# Patient Record
Sex: Male | Born: 2000 | Race: White | Hispanic: No | Marital: Single | Smoking: Never smoker
Health system: Southern US, Community
[De-identification: ages and names within clinical notes are randomized; demographics above are authoritative.]

---
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
## 2020-02-19 ENCOUNTER — Ambulatory Visit: Payer: Self-pay

## 2020-02-23 ENCOUNTER — Ambulatory Visit: Payer: Medicaid Other

## 2020-05-25 ENCOUNTER — Other Ambulatory Visit: Payer: Self-pay

## 2020-05-25 ENCOUNTER — Ambulatory Visit
Admission: EM | Admit: 2020-05-25 | Discharge: 2020-05-25 | Disposition: A | Discharge status: Home / Self Care | Payer: Medicaid Other | Attending: Emergency Medicine | Admitting: Emergency Medicine

## 2020-05-25 ENCOUNTER — Ambulatory Visit (INDEPENDENT_AMBULATORY_CARE_PROVIDER_SITE_OTHER): Payer: Medicaid Other

## 2020-05-25 DIAGNOSIS — M79645 Pain in left finger(s): Secondary | ICD-10-CM

## 2020-05-25 DIAGNOSIS — M79642 Pain in left hand: Secondary | ICD-10-CM | POA: Diagnosis not present

## 2020-05-25 NOTE — ED Triage Notes (Signed)
Pt had slip and fall yesterday and injured left hand , swelling noted

## 2020-05-25 NOTE — Discharge Instructions (Addendum)
  Take OTC Tylenol/ibuprofen as needed for pain Use a wrist splint/ may get it at [facility], [facility] or [facility] Follow RICE instruction that is attached Follow-up with PCP Return or go to ED for worsening of symptoms

## 2020-05-25 NOTE — ED Provider Notes (Signed)
[facility]   [id number] 05/25/20 Arrival Time: 1513   Chief Complaint  Patient presents with  . Hand Injury    SUBJECTIVE: History from: patient and family.  Russell Townsend is a 19 y.o. male who presented to the urgent care for complaint of left hand swelling and pain that occurred yesterday.  Reported reported a slip and fall.  He localizes the pain to the left hand.  He describes the pain as constant and achy.  He has tried OTC medications without relief.  His symptoms are made worse with ROM.  He denies similar symptoms in the past.  Denies chills, fever, nausea, vomiting, diarrhea.   ROS: As per HPI.  All other pertinent ROS negative.     History reviewed. No pertinent past medical history. Past Surgical History:  Procedure Laterality Date  . CLOSED REDUCTION ULNAR SHAFT Right 07/01/2015   Procedure: CLOSED REDUCTION RADIUS AND  ULNA  AND CAST APPLICATION;  Surgeon: Betha Loa, MD;  Location: [facility] OR;  Service: Orthopedics;  Laterality: Right;   No Known Allergies No current facility-administered medications on file prior to encounter.   Current Outpatient Medications on File Prior to Encounter  Medication Sig Dispense Refill  . fluticasone (FLONASE) 50 MCG/ACT nasal spray Place 2 sprays into both nostrils daily. 16 g 6  . ibuprofen (ADVIL,MOTRIN) 600 MG tablet Take 1 tablet (600 mg total) by mouth every 6 (six) hours as needed. 30 tablet 0   Social History   Socioeconomic History  . Marital status: Single    Spouse name: Not on file  . Number of children: Not on file  . Years of education: Not on file  . Highest education level: Not on file  Occupational History  . Not on file  Tobacco Use  . Smoking status: Never Smoker  . Smokeless tobacco: Never Used  Substance and Sexual Activity  . Alcohol use: No  . Drug use: No  . Sexual activity: Not on file  Other Topics Concern  . Not on file  Social History Narrative   Lives with paternal grandparents    No smokers   Social Determinants of Corporate investment banker Strain:   . Difficulty of Paying Living Expenses: Not on file  Food Insecurity:   . Worried About Programme researcher, broadcasting/film/video in the Last Year: Not on file  . Ran Out of Food in the Last Year: Not on file  Transportation Needs:   . Lack of Transportation (Medical): Not on file  . Lack of Transportation (Non-Medical): Not on file  Physical Activity:   . Days of Exercise per Week: Not on file  . Minutes of Exercise per Session: Not on file  Stress:   . Feeling of Stress : Not on file  Social Connections:   . Frequency of Communication with Friends and Family: Not on file  . Frequency of Social Gatherings with Friends and Family: Not on file  . Attends Religious Services: Not on file  . Active Member of Clubs or Organizations: Not on file  . Attends Banker Meetings: Not on file  . Marital Status: Not on file  Intimate Partner Violence:   . Fear of Current or Ex-Partner: Not on file  . Emotionally Abused: Not on file  . Physically Abused: Not on file  . Sexually Abused: Not on file   Family History  Problem Relation Age of Onset  . Crohn's disease Mother   . Hypertension Mother   . Healthy  Father   . Healthy Brother   . Hypertension Maternal Grandmother   . Hypertension Maternal Grandfather   . Diabetes Neg Hx   . Heart disease Neg Hx   . Hyperlipidemia Neg Hx   . Cancer Neg Hx     OBJECTIVE:  Vitals:   05/25/20 1630  BP: (!) 147/95  Pulse: 66  Resp: 16  Temp: 98.5 F (36.9 C)  SpO2: 98%     Physical Exam Vitals and nursing note reviewed.  Constitutional:      General: He is not in acute distress.    Appearance: Normal appearance. He is normal weight. He is not ill-appearing, toxic-appearing or diaphoretic.  Cardiovascular:     Rate and Rhythm: Normal rate and regular rhythm.     Pulses: Normal pulses.     Heart sounds: Normal heart sounds. No murmur heard.  No friction rub. No gallop.     Pulmonary:     Effort: Pulmonary effort is normal. No respiratory distress.     Breath sounds: Normal breath sounds. No stridor. No wheezing, rhonchi or rales.  Chest:     Chest wall: No tenderness.  Musculoskeletal:     Right hand: Normal.     Left hand: Swelling and tenderness present.     Comments: The left hand is with obvious deformity compared to the right hand.  No surface trauma, open wound, lesion, warmth.  Mild swelling present.  Limited range of motion due to pain.  Neurovascular status intact.  Neurological:     Mental Status: He is alert and oriented to person, place, and time.      LABS:  No results found for this or any previous visit (from the past 24 hour(s)).   RADIOLOGY:  DG Hand Complete Left  Result Date: 05/25/2020 CLINICAL DATA:  Fall.  Pain in thumb and index finger. EXAM: LEFT HAND - COMPLETE 3+ VIEW COMPARISON:  None. FINDINGS: There is no evidence of fracture or dislocation. There is no evidence of arthropathy or other focal bone abnormality. Soft tissues are unremarkable. IMPRESSION: Negative. Electronically Signed   By: Charlett Nose M.D.   On: 05/25/2020 16:53     Left hand x-ray is negative for bony abnormality including fracture or dislocation.  I have reviewed the x-ray myself and the radiologist interpretation.  I am in agreement with the radiologist interpretation.    ASSESSMENT & PLAN:  1. Left hand pain     No orders of the defined types were placed in this encounter.   Discharge instructions  Take OTC Tylenol/ibuprofen as needed for pain Follow RICE instruction that is attached Follow-up with PCP Return or go to ED for worsening of symptoms  Reviewed expectations re: course of current medical issues. Questions answered. Outlined signs and symptoms indicating need for more acute intervention. Patient verbalized understanding. After Visit Summary given.      Note: This document was prepared using Dragon voice recognition  software and may include unintentional dictation errors.    Durward Parcel, FNP 05/25/20 1707

## 2021-01-24 ENCOUNTER — Other Ambulatory Visit: Payer: Self-pay

## 2021-01-24 ENCOUNTER — Emergency Department (HOSPITAL_COMMUNITY): Payer: Medicaid Other

## 2021-01-24 ENCOUNTER — Emergency Department (HOSPITAL_COMMUNITY)
Admission: EM | Admit: 2021-01-24 | Discharge: 2021-01-24 | Disposition: A | Discharge status: Home / Self Care | Payer: Medicaid Other | Attending: Emergency Medicine | Admitting: Emergency Medicine

## 2021-01-24 ENCOUNTER — Encounter (HOSPITAL_COMMUNITY): Payer: Self-pay | Admitting: *Deleted

## 2021-01-24 DIAGNOSIS — U071 COVID-19: Secondary | ICD-10-CM | POA: Insufficient documentation

## 2021-01-24 DIAGNOSIS — J3489 Other specified disorders of nose and nasal sinuses: Secondary | ICD-10-CM | POA: Insufficient documentation

## 2021-01-24 DIAGNOSIS — Z28310 Unvaccinated for covid-19: Secondary | ICD-10-CM | POA: Insufficient documentation

## 2021-01-24 DIAGNOSIS — R000 Tachycardia, unspecified: Secondary | ICD-10-CM | POA: Diagnosis not present

## 2021-01-24 DIAGNOSIS — R509 Fever, unspecified: Secondary | ICD-10-CM | POA: Diagnosis not present

## 2021-01-24 LAB — CBC WITH DIFFERENTIAL/PLATELET
Abs Immature Granulocytes: 0.05 10*3/uL (ref 0.00–0.07)
Basophils Absolute: 0 10*3/uL (ref 0.0–0.1)
Basophils Relative: 1 %
Eosinophils Absolute: 0.2 10*3/uL (ref 0.0–0.5)
Eosinophils Relative: 2 %
HCT: 48.6 % (ref 39.0–52.0)
Hemoglobin: 16.3 g/dL (ref 13.0–17.0)
Immature Granulocytes: 1 %
Lymphocytes Relative: 10 %
Lymphs Abs: 0.9 10*3/uL (ref 0.7–4.0)
MCH: 30.9 pg (ref 26.0–34.0)
MCHC: 33.5 g/dL (ref 30.0–36.0)
MCV: 92 fL (ref 80.0–100.0)
Monocytes Absolute: 1.2 10*3/uL — ABNORMAL HIGH (ref 0.1–1.0)
Monocytes Relative: 14 %
Neutro Abs: 6.1 10*3/uL (ref 1.7–7.7)
Neutrophils Relative %: 72 %
Platelets: 201 10*3/uL (ref 150–400)
RBC: 5.28 MIL/uL (ref 4.22–5.81)
RDW: 12.1 % (ref 11.5–15.5)
WBC: 8.4 10*3/uL (ref 4.0–10.5)
nRBC: 0 % (ref 0.0–0.2)

## 2021-01-24 LAB — BASIC METABOLIC PANEL
Anion gap: 10 (ref 5–15)
BUN: 9 mg/dL (ref 6–20)
CO2: 25 mmol/L (ref 22–32)
Calcium: 9 mg/dL (ref 8.9–10.3)
Chloride: 99 mmol/L (ref 98–111)
Creatinine, Ser: 1 mg/dL (ref 0.61–1.24)
GFR, Estimated: >60 mL/min (ref >60)
Glucose, Bld: 102 mg/dL — ABNORMAL HIGH (ref 70–99)
Potassium: 4 mmol/L (ref 3.5–5.1)
Sodium: 134 mmol/L — ABNORMAL LOW (ref 135–145)

## 2021-01-24 LAB — RESP PANEL BY RT-PCR (FLU A&B, COVID) ARPGX2
Influenza A by PCR: NEGATIVE
Influenza B by PCR: NEGATIVE
SARS Coronavirus 2 by RT PCR: POSITIVE — AB

## 2021-01-24 MED ORDER — SODIUM CHLORIDE 0.9 % IV BOLUS
1000.0000 mL | Freq: Once | INTRAVENOUS | Status: AC
Start: 2021-01-24 — End: 2021-01-24
  Administered 2021-01-24: 1000 mL via INTRAVENOUS

## 2021-01-24 MED ORDER — ACETAMINOPHEN 325 MG PO TABS
650.0000 mg | ORAL_TABLET | Freq: Once | ORAL | Status: AC | PRN
  Administered 2021-01-24: 650 mg via ORAL
  Filled 2021-01-24: qty 2

## 2021-01-24 NOTE — ED Notes (Signed)
Pt in bed, pt states that he has some generalized body aches, pt states that he is ready to go home.

## 2021-01-24 NOTE — ED Provider Notes (Signed)
3:20 PM-checkout from Dr. Charm Barges to evaluate patient after return of testing.  Dr. Charm Barges suspects that the patient has COVID.  5:45 PM-testing returned, COVID-positive, remainder of labs are reassuring.  Temperature improved following treatment with Tylenol.  At this time the patient is alert nontoxic no respiratory distress.  Findings discussed with the patient about how to care for self during this illness.  Appropriate discharge instructions given   Mancel Bale, MD 01/24/21 1746

## 2021-01-24 NOTE — ED Triage Notes (Signed)
C/o fever, chills, body aches  onset yesterday. Concerned he may have a fever due to working environment

## 2021-01-24 NOTE — Discharge Instructions (Signed)
Is important to wear a mask when you are around other people.  He need to do this for at least 5 days, longer if you continue to be ill with fever, coughing, weakness or vomiting.  Use Tylenol for pain or fever.  Use Robitussin-DM for cough.  See the doctor of your choice for problems during this illness.

## 2021-01-24 NOTE — ED Provider Notes (Signed)
[facility] EMERGENCY DEPARTMENT Provider Note   CSN: [id number] Arrival date & time: 01/24/21  1133     History Chief Complaint  Patient presents with  . Fever    Russell Townsend is a 20 y.o. male.  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  He uses a respirator when he does that but says it does not fit very well.  He is complaining of fevers chills body aches shortness of breath that started yesterday.  He is not COVID vaccinated.  The history is provided by the patient.  Fever Max temp prior to arrival:  103 Temp source:  Oral Severity:  Moderate Onset quality:  Gradual Duration:  2 days Timing:  Intermittent Progression:  Unchanged Chronicity:  New Ineffective treatments:  None tried Associated symptoms: cough, headaches, myalgias and rhinorrhea   Associated symptoms: no chest pain, no confusion, no dysuria, no rash and no sore throat   Risk factors: occupational exposure   Risk factors: no recent travel and no sick contacts        History reviewed. No pertinent past medical history.  Patient Active Problem List   Diagnosis Date Noted  . Closed Salter-Harris type II physeal fracture of distal end of right radius with routine healing 07/29/2015  . Fracture of right radius and ulna 07/01/2015  . Acute maxillary sinusitis 08/19/2014  . Keratosis pilaris 05/05/2014  . Rash 07/15/2013  . Hand, foot and mouth disease 07/15/2013    Past Surgical History:  Procedure Laterality Date  . CLOSED REDUCTION ULNAR SHAFT Right 07/01/2015   Procedure: CLOSED REDUCTION RADIUS AND  ULNA  AND CAST APPLICATION;  Surgeon: Betha Loa, MD;  Location: [facility] OR;  Service: Orthopedics;  Laterality: Right;       Family History  Problem Relation Age of Onset  . Crohn's disease Mother   . Hypertension Mother   . Healthy Father   . Healthy Brother   . Hypertension Maternal Grandmother   . Hypertension  Maternal Grandfather   . Diabetes Neg Hx   . Heart disease Neg Hx   . Hyperlipidemia Neg Hx   . Cancer Neg Hx     Social History   Tobacco Use  . Smoking status: Never Smoker  . Smokeless tobacco: Never Used  Substance Use Topics  . Alcohol use: No  . Drug use: No    Home Medications Prior to Admission medications   Medication Sig Start Date End Date Taking? Authorizing Provider  fluticasone (FLONASE) 50 MCG/ACT nasal spray Place 2 sprays into both nostrils daily. 02/21/16   McDonell, Alfredia Client, MD  ibuprofen (ADVIL,MOTRIN) 600 MG tablet Take 1 tablet (600 mg total) by mouth every 6 (six) hours as needed. 05/27/18   Ivery Quale, PA-C    Allergies    Patient has no known allergies.  Review of Systems   Review of Systems  Constitutional: Positive for fever.  HENT: Positive for rhinorrhea. Negative for sore throat.   Eyes: Negative for visual disturbance.  Respiratory: Positive for cough. Negative for shortness of breath.   Cardiovascular: Negative for chest pain.  Gastrointestinal: Negative for abdominal pain.  Genitourinary: Negative for dysuria.  Musculoskeletal: Positive for myalgias.  Skin: Negative for rash.  Neurological: Positive for headaches.  Psychiatric/Behavioral: Negative for confusion.    Physical Exam Updated Vital Signs BP (!) 142/86 (BP Location: Right Arm)   Pulse (!) 122   Temp (!) 102.5  F (39.2 C) (Oral)   Resp 17   SpO2 93%   Physical Exam Vitals and nursing note reviewed.  Constitutional:      Appearance: Normal appearance. He is well-developed.  HENT:     Head: Normocephalic and atraumatic.     Right Ear: Tympanic membrane normal.     Left Ear: Tympanic membrane normal.     Nose: Nose normal.     Mouth/Throat:     Mouth: Mucous membranes are moist.     Pharynx: Oropharynx is clear. No posterior oropharyngeal erythema.  Eyes:     Conjunctiva/sclera: Conjunctivae normal.  Cardiovascular:     Rate and Rhythm: Regular rhythm.  Tachycardia present.     Heart sounds: No murmur heard.   Pulmonary:     Effort: Pulmonary effort is normal. No respiratory distress.     Breath sounds: Normal breath sounds.  Abdominal:     Palpations: Abdomen is soft.     Tenderness: There is no abdominal tenderness. There is no guarding or rebound.  Musculoskeletal:        General: No deformity or signs of injury. Normal range of motion.     Cervical back: Neck supple. No tenderness.  Lymphadenopathy:     Cervical: No cervical adenopathy.  Skin:    General: Skin is warm and dry.     Capillary Refill: Capillary refill takes less than 2 seconds.  Neurological:     General: No focal deficit present.     Mental Status: He is alert and oriented to person, place, and time.     GCS: GCS eye subscore is 4. GCS verbal subscore is 5. GCS motor subscore is 6.     Motor: No weakness.     ED Results / Procedures / Treatments   Labs (all labs ordered are listed, but only abnormal results are displayed) Labs Reviewed  RESP PANEL BY RT-PCR (FLU A&B, COVID) ARPGX2 - Abnormal; Notable for the following components:      Result Value   SARS Coronavirus 2 by RT PCR POSITIVE (*)    All other components within normal limits  BASIC METABOLIC PANEL - Abnormal; Notable for the following components:   Sodium 134 (*)    Glucose, Bld 102 (*)    All other components within normal limits  CBC WITH DIFFERENTIAL/PLATELET - Abnormal; Notable for the following components:   Monocytes Absolute 1.2 (*)    All other components within normal limits    EKG None  Radiology DG Chest Port 1 View  Result Date: 01/24/2021 CLINICAL DATA:  Fever EXAM: PORTABLE CHEST 1 VIEW COMPARISON:  None. FINDINGS: Lungs are clear. Heart size and pulmonary vascularity are normal. No adenopathy. No bone lesions. IMPRESSION: Lungs clear.  Heart size normal. Electronically Signed   By: Bretta Bang III M.D.   On: 01/24/2021 14:45    Procedures Procedures    Medications Ordered in ED Medications  sodium chloride 0.9 % bolus 1,000 mL (has no administration in time range)  acetaminophen (TYLENOL) tablet 650 mg (650 mg Oral Given 01/24/21 1201)    ED Course  I have reviewed the triage vital signs and the nursing notes.  Pertinent labs & imaging results that were available during my care of the patient were reviewed by me and considered in my medical decision making (see chart for details).  Clinical Course as of 01/24/21 1808  Tue Jan 24, 2021  1433 Chest x-ray interpreted by me as no acute infiltrates. [MB]  Clinical Course User Index [MB] Terrilee Files, MD   MDM Rules/Calculators/A&P                         Russell Townsend was evaluated in Emergency Department on 01/24/2021 for the symptoms described in the history of present illness. He was evaluated in the context of the global COVID-19 pandemic, which necessitated consideration that the patient might be at risk for infection with the SARS-CoV-2 virus that causes COVID-19. Institutional protocols and algorithms that pertain to the evaluation of patients at risk for COVID-19 are in a state of rapid change based on information released by regulatory bodies including the CDC and federal and state organizations. These policies and algorithms were followed during the patient's care in the ED.   Final Clinical Impression(s) / ED Diagnoses Final diagnoses:  COVID-19 virus infection    Rx / DC Orders ED Discharge Orders    None       Terrilee Files, MD 01/24/21 [phone]

## 2021-01-25 ENCOUNTER — Telehealth: Payer: Self-pay | Admitting: *Deleted

## 2021-01-25 NOTE — Telephone Encounter (Signed)
Transition Care Management Follow-up Telephone Call  Date of discharge and from where: 01/24/2021 - [facility]  How have you been since you were released from the hospital? "About the same"  Any questions or concerns? No  Items Reviewed:  Did the pt receive and understand the discharge instructions provided? Yes   Medications obtained and verified? Yes   Other? No   Any new allergies since your discharge? No   Dietary orders reviewed? No  Do you have support at home? Yes    Functional Questionnaire: (I = Independent and D = Dependent) ADLs: I  Bathing/Dressing- I  Meal Prep- I  Eating- I  Maintaining continence- I  Transferring/Ambulation- I  Managing Meds- I  Follow up appointments reviewed:   PCP Hospital f/u appt confirmed? No    Specialist Hospital f/u appt confirmed? No    Are transportation arrangements needed? N/A  If their condition worsens, is the pt aware to call PCP or go to the Emergency Dept.? Yes  Was the patient provided with contact information for the PCP's office or ED? Yes  Was to pt encouraged to call back with questions or concerns? Yes

## 2021-01-31 ENCOUNTER — Other Ambulatory Visit: Payer: Self-pay

## 2021-01-31 ENCOUNTER — Emergency Department (HOSPITAL_COMMUNITY)
Admission: EM | Admit: 2021-01-31 | Discharge: 2021-01-31 | Disposition: A | Discharge status: Home / Self Care | Payer: Medicaid Other | Attending: Emergency Medicine | Admitting: Emergency Medicine

## 2021-01-31 ENCOUNTER — Encounter (HOSPITAL_COMMUNITY): Payer: Self-pay | Admitting: Emergency Medicine

## 2021-01-31 DIAGNOSIS — U071 COVID-19: Secondary | ICD-10-CM | POA: Diagnosis not present

## 2021-01-31 DIAGNOSIS — H9202 Otalgia, left ear: Secondary | ICD-10-CM | POA: Insufficient documentation

## 2021-01-31 DIAGNOSIS — R509 Fever, unspecified: Secondary | ICD-10-CM | POA: Diagnosis present

## 2021-01-31 MED ORDER — ONDANSETRON 4 MG PO TBDP: 4.0000 mg | ORAL_TABLET | Freq: Three times a day (TID) | ORAL | 1 refills | Status: DC | PRN

## 2021-01-31 NOTE — Discharge Instructions (Addendum)
Left ear shows some signs of pressure congestion.  But not consistent with the ear infection.  Zofran provided if the nausea and vomiting comes back.  Also would recommend taking 400 mg of Motrin, ibuprofen, and Advil every 6 hours for the next few days to help cut down the inflammation of the left ear.  Also could consider taking over-the-counter cold and flu meds.

## 2021-01-31 NOTE — ED Triage Notes (Signed)
Pt c/o left sided facial/jaw pain for the past few days. States pain is moving towards his ear. Pt dx with COVID on 01/24/21.

## 2021-01-31 NOTE — ED Provider Notes (Signed)
[facility] EMERGENCY DEPARTMENT Provider Note   CSN: [id number] Arrival date & time: 01/31/21  4158     History No chief complaint on file.   Russell Townsend is a 20 y.o. male.  Patient's been symptomatic with COVID infection since Friday.  Patient formally diagnosed on May 17.  Patient states has been taken Tylenol for his fever.  Has not had any difficulty with shortness of breath.  Does have some congestion.  Came in today because developed pain left side jaw left ear area.  Moved up into his temporal area of his head.  Had a headache and had nausea and vomiting.  All symptoms now have improved significantly.  Patient is never experienced anything like that before.  Denies any tooth pain.  Denies any pain with opening closing his jaw.  Symptoms onset was about 4 in the morning.  Now feeling much better.        History reviewed. No pertinent past medical history.  Patient Active Problem List   Diagnosis Date Noted  . Closed Salter-Harris type II physeal fracture of distal end of right radius with routine healing 07/29/2015  . Fracture of right radius and ulna 07/01/2015  . Acute maxillary sinusitis 08/19/2014  . Keratosis pilaris 05/05/2014  . Rash 07/15/2013  . Hand, foot and mouth disease 07/15/2013    Past Surgical History:  Procedure Laterality Date  . CLOSED REDUCTION ULNAR SHAFT Right 07/01/2015   Procedure: CLOSED REDUCTION RADIUS AND  ULNA  AND CAST APPLICATION;  Surgeon: Betha Loa, MD;  Location: [facility] OR;  Service: Orthopedics;  Laterality: Right;       Family History  Problem Relation Age of Onset  . Crohn's disease Mother   . Hypertension Mother   . Healthy Father   . Healthy Brother   . Hypertension Maternal Grandmother   . Hypertension Maternal Grandfather   . Diabetes Neg Hx   . Heart disease Neg Hx   . Hyperlipidemia Neg Hx   . Cancer Neg Hx     Social History   Tobacco Use  . Smoking status: Never Smoker  . Smokeless tobacco: Never Used   Substance Use Topics  . Alcohol use: No  . Drug use: No    Home Medications Prior to Admission medications   Medication Sig Start Date End Date Taking? Authorizing Provider  ondansetron (ZOFRAN ODT) 4 MG disintegrating tablet Take 1 tablet (4 mg total) by mouth every 8 (eight) hours as needed. 01/31/21  Yes Vanetta Mulders, MD  fluticasone (FLONASE) 50 MCG/ACT nasal spray Place 2 sprays into both nostrils daily. 02/21/16   McDonell, Alfredia Client, MD  ibuprofen (ADVIL,MOTRIN) 600 MG tablet Take 1 tablet (600 mg total) by mouth every 6 (six) hours as needed. 05/27/18   Ivery Quale, PA-C    Allergies    Patient has no known allergies.  Review of Systems   Review of Systems  Constitutional: Negative for chills and fever.  HENT: Positive for congestion and ear pain. Negative for rhinorrhea and sore throat.   Eyes: Negative for visual disturbance.  Respiratory: Negative for cough and shortness of breath.   Cardiovascular: Negative for chest pain and leg swelling.  Gastrointestinal: Negative for abdominal pain, diarrhea, nausea and vomiting.  Genitourinary: Negative for dysuria.  Musculoskeletal: Negative for back pain and neck pain.  Skin: Negative for rash.  Neurological: Negative for dizziness, light-headedness and headaches.  Hematological: Does not bruise/bleed easily.  Psychiatric/Behavioral: Negative for confusion.    Physical Exam Updated Vital Signs  BP 125/80   Pulse 88   Temp 98.4 F (36.9 C)   Resp 18   Ht 1.753 m (5\' 9" )   Wt 87.1 kg   SpO2 98%   BMI 28.36 kg/m   Physical Exam Vitals and nursing note reviewed.  Constitutional:      General: He is not in acute distress.    Appearance: Normal appearance. He is well-developed.  HENT:     Head: Normocephalic and atraumatic.     Right Ear: Tympanic membrane and ear canal normal. There is no impacted cerumen.     Left Ear: There is no impacted cerumen.     Ears:     Comments: Right tympanic membrane and canals  normal.  Left tympanic membrane has some slight erythema but no bulging.  External canals normal. Eyes:     Extraocular Movements: Extraocular movements intact.     Conjunctiva/sclera: Conjunctivae normal.     Pupils: Pupils are equal, round, and reactive to light.  Cardiovascular:     Rate and Rhythm: Normal rate and regular rhythm.     Heart sounds: No murmur heard.   Pulmonary:     Effort: Pulmonary effort is normal. No respiratory distress.     Breath sounds: Normal breath sounds.  Abdominal:     Palpations: Abdomen is soft.     Tenderness: There is no abdominal tenderness.  Musculoskeletal:     Cervical back: Normal range of motion and neck supple. No rigidity.  Lymphadenopathy:     Cervical: No cervical adenopathy.  Skin:    General: Skin is warm and dry.     Capillary Refill: Capillary refill takes less than 2 seconds.  Neurological:     General: No focal deficit present.     Mental Status: He is alert and oriented to person, place, and time.     Cranial Nerves: No cranial nerve deficit.     Sensory: No sensory deficit.     Motor: No weakness.     ED Results / Procedures / Treatments   Labs (all labs ordered are listed, but only abnormal results are displayed) Labs Reviewed - No data to display  EKG None  Radiology No results found.  Procedures Procedures   Medications Ordered in ED Medications - No data to display  ED Course  I have reviewed the triage vital signs and the nursing notes.  Pertinent labs & imaging results that were available during my care of the patient were reviewed by me and considered in my medical decision making (see chart for details).    MDM Rules/Calculators/A&P                          This patient not having any ear pain now.  Think that pain was secondary to eustachian tube dysfunction and some pressure buildup.  There is definitely some evidence of the left TM being erythematous.  But not bulging.  And since no pain now do  not think it is an acute otitis media.  Will provide patient with some Zofran for the nausea and vomiting which has now resolved in case it comes back have him take Motrin for the next several days.  And consider over-the-counter cold and flu so that a decongestant would help keep the congestion down.  Overall from the COVID patient is doing very well.  No hypoxia.  No shortness of breath.  Patient states no longer having fevers.   Final Clinical Impression(s) /  ED Diagnoses Final diagnoses:  COVID  Left ear pain    Rx / DC Orders ED Discharge Orders         Ordered    ondansetron (ZOFRAN ODT) 4 MG disintegrating tablet  Every 8 hours PRN        01/31/21 0814           Vanetta Mulders, MD 01/31/21 [phone]

## 2021-01-31 NOTE — ED Notes (Signed)
See triage notes. Nad.  

## 2021-02-01 ENCOUNTER — Telehealth: Payer: Self-pay | Admitting: *Deleted

## 2021-02-01 NOTE — Telephone Encounter (Signed)
Transition Care Management Follow-up Telephone Call  Date of discharge and from where: 01/31/2021 - [facility]  How have you been since you were released from the hospital? "I am okay"  Any questions or concerns? No  Items Reviewed:  Did the pt receive and understand the discharge instructions provided? Yes   Medications obtained and verified? Yes   Other? No   Any new allergies since your discharge? No   Dietary orders reviewed? No  Do you have support at home? Yes     Functional Questionnaire: (I = Independent and D = Dependent) ADLs: I  Bathing/Dressing- I  Meal Prep- I  Eating- I  Maintaining continence- I  Transferring/Ambulation- I  Managing Meds- I  Follow up appointments reviewed:   PCP Hospital f/u appt confirmed? No    Specialist Hospital f/u appt confirmed? No    Are transportation arrangements needed? No   If their condition worsens, is the pt aware to call PCP or go to the Emergency Dept.? Yes  Was the patient provided with contact information for the PCP's office or ED? Yes  Was to pt encouraged to call back with questions or concerns? Yes

## 2021-05-25 DIAGNOSIS — Z77120 Contact with and (suspected) exposure to mold (toxic): Secondary | ICD-10-CM | POA: Diagnosis not present

## 2021-05-25 DIAGNOSIS — Z68.41 Body mass index (BMI) pediatric, 85th percentile to less than 95th percentile for age: Secondary | ICD-10-CM | POA: Diagnosis not present

## 2021-05-25 DIAGNOSIS — J069 Acute upper respiratory infection, unspecified: Secondary | ICD-10-CM | POA: Diagnosis not present

## 2021-05-25 DIAGNOSIS — R059 Cough, unspecified: Secondary | ICD-10-CM | POA: Diagnosis not present

## 2021-05-25 DIAGNOSIS — R0981 Nasal congestion: Secondary | ICD-10-CM | POA: Diagnosis not present

## 2021-07-11 DIAGNOSIS — H1013 Acute atopic conjunctivitis, bilateral: Secondary | ICD-10-CM | POA: Diagnosis not present

## 2021-07-11 DIAGNOSIS — J3081 Allergic rhinitis due to animal (cat) (dog) hair and dander: Secondary | ICD-10-CM | POA: Diagnosis not present

## 2021-07-11 DIAGNOSIS — J3089 Other allergic rhinitis: Secondary | ICD-10-CM | POA: Diagnosis not present

## 2021-07-11 DIAGNOSIS — J301 Allergic rhinitis due to pollen: Secondary | ICD-10-CM | POA: Diagnosis not present

## 2021-10-21 IMAGING — DX DG CHEST 1V PORT
1 series · 1 of 1 positions shown · non-contrast
Comparison: None.

CLINICAL DATA: Fever

EXAM:
PORTABLE CHEST 1 VIEW

[chest ap]
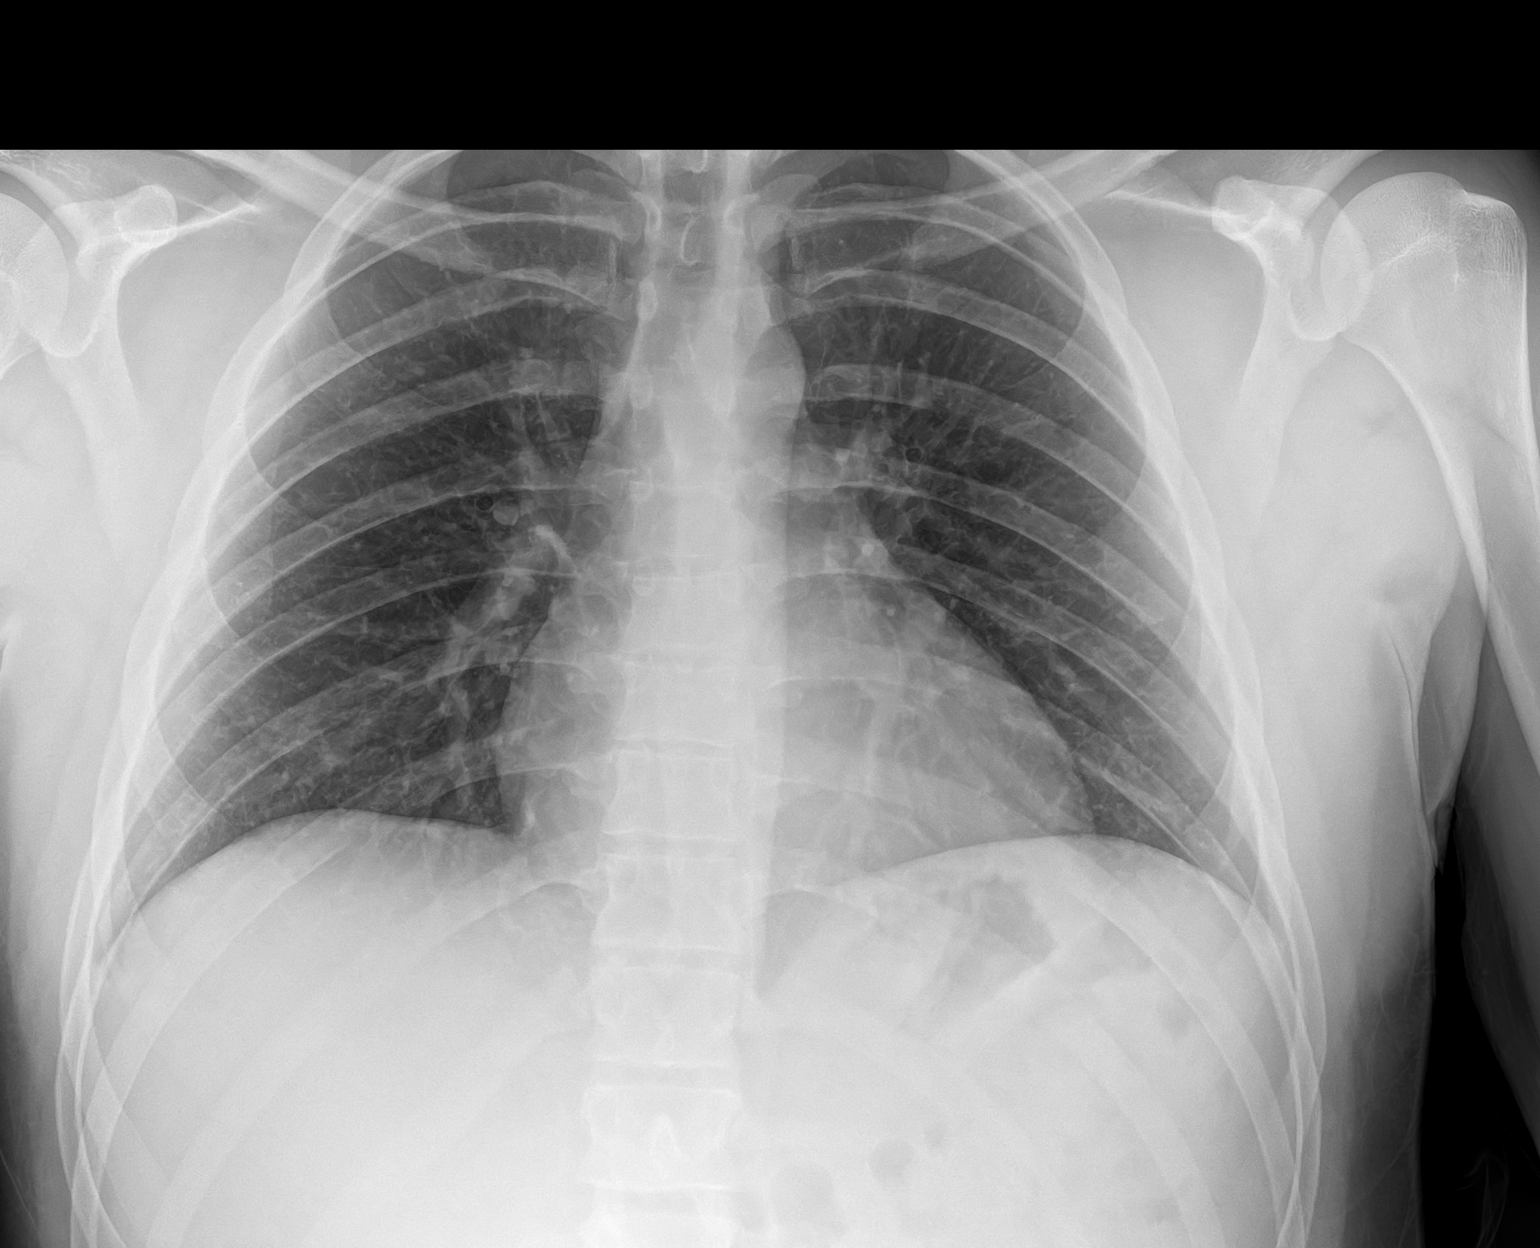

[1 of 1 positions shown; findings below may reference images not displayed]

FINDINGS: Lungs are clear. Heart size and pulmonary vascularity are normal. No
adenopathy. No bone lesions.
IMPRESSION: Lungs clear.  Heart size normal.

## 2022-09-21 ENCOUNTER — Emergency Department (HOSPITAL_COMMUNITY)
Admission: EM | Admit: 2022-09-21 | Discharge: 2022-09-21 | Disposition: A | Discharge status: Home / Self Care | Payer: Medicaid Other | Attending: Emergency Medicine | Admitting: Emergency Medicine

## 2022-09-21 ENCOUNTER — Other Ambulatory Visit: Payer: Self-pay

## 2022-09-21 DIAGNOSIS — Z20822 Contact with and (suspected) exposure to covid-19: Secondary | ICD-10-CM | POA: Diagnosis not present

## 2022-09-21 DIAGNOSIS — J029 Acute pharyngitis, unspecified: Secondary | ICD-10-CM | POA: Insufficient documentation

## 2022-09-21 LAB — RESP PANEL BY RT-PCR (RSV, FLU A&B, COVID)  RVPGX2
Influenza A by PCR: NEGATIVE
Influenza B by PCR: NEGATIVE
Resp Syncytial Virus by PCR: NEGATIVE
SARS Coronavirus 2 by RT PCR: NEGATIVE

## 2022-09-21 LAB — GROUP A STREP BY PCR: Group A Strep by PCR: NOT DETECTED

## 2022-09-21 MED ORDER — LIDOCAINE VISCOUS HCL 2 % MT SOLN
15.0000 mL | Freq: Once | OROMUCOSAL | Status: AC
  Administered 2022-09-21: 15 mL via ORAL
  Filled 2022-09-21: qty 15

## 2022-09-21 MED ORDER — ALUM & MAG HYDROXIDE-SIMETH 200-200-20 MG/5ML PO SUSP
30.0000 mL | Freq: Once | ORAL | Status: AC
  Administered 2022-09-21: 30 mL via ORAL
  Filled 2022-09-21: qty 30

## 2022-09-21 NOTE — ED Triage Notes (Signed)
Pt c/o sore throat, headache, aching in legs, nasal drainage x 5 days. Afebrile.

## 2022-09-21 NOTE — ED Notes (Signed)
ED Provider at bedside. 

## 2022-09-21 NOTE — ED Provider Notes (Signed)
[facility] Provider Note   CSN: [id number] Arrival date & time: 09/21/22  9528     History  Chief Complaint  Patient presents with   Sore Throat    Russell Townsend is a 22 y.o. male.   Sore Throat     Patient presents due to sore throat x 5 days.  Symptoms have been constant, also endorses swollen lymph nodes, nasal congestion.  No medicine prior to arrival.  No fevers at home, with her intermediate strep throat over Christmas.  Here because his job requires him to be evaluated before returning back to work.  Denies any chest pain, shortness of breath, vomiting, diarrhea.  Home Medications Prior to Admission medications   Medication Sig Start Date End Date Taking? Authorizing Provider  fluticasone (FLONASE) 50 MCG/ACT nasal spray Place 2 sprays into both nostrils daily. 02/21/16   McDonell, Kyra Manges, MD  ibuprofen (ADVIL,MOTRIN) 600 MG tablet Take 1 tablet (600 mg total) by mouth every 6 (six) hours as needed. 05/27/18   Lily Kocher, PA-C  ondansetron (ZOFRAN ODT) 4 MG disintegrating tablet Take 1 tablet (4 mg total) by mouth every 8 (eight) hours as needed. 01/31/21   Fredia Sorrow, MD      Allergies    Patient has no known allergies.    Review of Systems   Review of Systems  Physical Exam Updated Vital Signs BP (!) 146/83   Pulse 66   Temp 98.6 F (37 C) (Oral)   Resp 15   Ht 5\' 10"  (1.778 m)   Wt 79.4 kg   SpO2 100%   BMI 25.11 kg/m  Physical Exam Vitals and nursing note reviewed. Exam conducted with a chaperone present.  Constitutional:      General: He is not in acute distress.    Appearance: Normal appearance.  HENT:     Head: Normocephalic and atraumatic.     Mouth/Throat:     Pharynx: Uvula midline. Posterior oropharyngeal erythema present.     Tonsils: No tonsillar exudate.  Eyes:     General: No scleral icterus.       Right eye: No discharge.        Left eye: No discharge.     Extraocular Movements: Extraocular movements  intact.     Pupils: Pupils are equal, round, and reactive to light.  Cardiovascular:     Rate and Rhythm: Normal rate and regular rhythm.     Pulses: Normal pulses.     Heart sounds: Normal heart sounds.     No friction rub. No gallop.  Pulmonary:     Effort: Pulmonary effort is normal. No respiratory distress.     Breath sounds: Normal breath sounds.     Comments: Lungs are clear to auscultation bilaterally Abdominal:     General: Abdomen is flat. Bowel sounds are normal. There is no distension.     Palpations: Abdomen is soft.     Tenderness: There is no abdominal tenderness.  Skin:    General: Skin is warm and dry.     Coloration: Skin is not jaundiced.  Neurological:     Mental Status: He is alert. Mental status is at baseline.     Coordination: Coordination normal.     ED Results / Procedures / Treatments   Labs (all labs ordered are listed, but only abnormal results are displayed) Labs Reviewed  GROUP A STREP BY PCR  RESP PANEL BY RT-PCR (RSV, FLU A&B, COVID)  RVPGX2    EKG None  Radiology No results found.  Procedures Procedures    Medications Ordered in ED Medications  alum & mag hydroxide-simeth (MAALOX/MYLANTA) 200-200-20 MG/5ML suspension 30 mL (has no administration in time range)    And  lidocaine (XYLOCAINE) 2 % viscous mouth solution 15 mL (has no administration in time range)    ED Course/ Medical Decision Making/ A&P Clinical Course as of 09/21/22 0952  Fri Sep 21, 2022  0941 Group A Strep by PCR Negative [HS]  0952 Resp panel by RT-PCR (RSV, Flu A&B, Covid) Throat Negative  [HS]    Clinical Course User Index [HS] Sherrill Raring, PA-C                           Medical Decision Making Amount and/or Complexity of Data Reviewed Labs:  Decision-making details documented in ED Course.  Risk OTC drugs. Prescription drug management.   Patient presents to the emergency department due to sore throat x 5 days.  Differential includes not  limited to strep pharyngitis, viral pharyngitis, Ludwig angina, retropharyngeal abscess, peritonsillar abscess, sepsis.   On exam patient is tolerating secretions, no signs of Ludwig angina, peritonsillar abscess, retropharyngeal abscess.  No SIRS criteria. Suspect viral tonsillitis versus strep tonsillitis.  Will swab for viral panel and strep test.   I ordered Maalox and viscous lidocaine for symptom management in the ED.  I ordered, viewed and interpreted laboratory workup as documented ED course.  None  Patient has a viral diagnosis, negative respiratory panel for COVID, flu and negative for strep.  Return precautions discussed, stable for discharge at this time.        Final Clinical Impression(s) / ED Diagnoses Final diagnoses:  None    Rx / DC Orders ED Discharge Orders     None         Sherrill Raring, Hershal Coria 09/21/22 Barth Kirks, MD 09/22/22 1011

## 2022-09-21 NOTE — Discharge Instructions (Addendum)
You're workup was reassuring.  I suspect you have a viral process, follow-up with your primary doctor next week for reevaluation if symptoms persist.  Go back to work on Monday.  Wear a mask as long as you are symptomatic.

## 2022-09-30 ENCOUNTER — Other Ambulatory Visit: Payer: Self-pay

## 2022-09-30 ENCOUNTER — Encounter (HOSPITAL_COMMUNITY): Payer: Self-pay

## 2022-09-30 ENCOUNTER — Emergency Department (HOSPITAL_COMMUNITY)
Admission: EM | Admit: 2022-09-30 | Discharge: 2022-09-30 | Disposition: A | Discharge status: Home / Self Care | Payer: Medicaid Other | Attending: Emergency Medicine | Admitting: Emergency Medicine

## 2022-09-30 DIAGNOSIS — J029 Acute pharyngitis, unspecified: Secondary | ICD-10-CM | POA: Diagnosis not present

## 2022-09-30 MED ORDER — PREDNISONE 20 MG PO TABS: 40.0000 mg | ORAL_TABLET | Freq: Every day | ORAL | 0 refills | Status: DC

## 2022-09-30 MED ORDER — AMOXICILLIN 250 MG PO CAPS
500.0000 mg | ORAL_CAPSULE | Freq: Once | ORAL | Status: AC
  Administered 2022-09-30: 500 mg via ORAL
  Filled 2022-09-30: qty 2

## 2022-09-30 MED ORDER — PREDNISONE 50 MG PO TABS
60.0000 mg | ORAL_TABLET | Freq: Once | ORAL | Status: AC
  Administered 2022-09-30: 60 mg via ORAL
  Filled 2022-09-30: qty 1

## 2022-09-30 MED ORDER — AMOXICILLIN 500 MG PO CAPS: 500.0000 mg | ORAL_CAPSULE | Freq: Three times a day (TID) | ORAL | 0 refills | Status: DC

## 2022-09-30 NOTE — ED Provider Notes (Signed)
[facility] Provider Note   CSN: [id number] Arrival date & time: 09/30/22  0244     History  Chief Complaint  Patient presents with   Sore Throat    Russell Townsend is a 22 y.o. male.  Patient presents to the emergency department for evaluation of persistent sore throat.  He was seen in the emergency department 2 weeks ago and had strep, flu and COVID testing that were negative.  He has been treating with topical lidocaine and OTC medications but it is not helping.       Home Medications Prior to Admission medications   Medication Sig Start Date End Date Taking? Authorizing Provider  amoxicillin (AMOXIL) 500 MG capsule Take 1 capsule (500 mg total) by mouth 3 (three) times daily. 09/30/22  Yes Nirvana Blanchett, Gwenyth Allegra, MD  predniSONE (DELTASONE) 20 MG tablet Take 2 tablets (40 mg total) by mouth daily with breakfast. 09/30/22  Yes Slyvia Lartigue, Gwenyth Allegra, MD  fluticasone (FLONASE) 50 MCG/ACT nasal spray Place 2 sprays into both nostrils daily. 02/21/16   McDonell, Kyra Manges, MD  ibuprofen (ADVIL,MOTRIN) 600 MG tablet Take 1 tablet (600 mg total) by mouth every 6 (six) hours as needed. 05/27/18   Lily Kocher, PA-C  ondansetron (ZOFRAN ODT) 4 MG disintegrating tablet Take 1 tablet (4 mg total) by mouth every 8 (eight) hours as needed. 01/31/21   Fredia Sorrow, MD      Allergies    Patient has no known allergies.    Review of Systems   Review of Systems  Physical Exam Updated Vital Signs BP 129/79 (BP Location: Right Arm)   Pulse 82   Temp 98 F (36.7 C) (Oral)   Resp 16   Ht 5\' 10"  (1.778 m)   Wt 79.4 kg   SpO2 98%   BMI 25.11 kg/m  Physical Exam Vitals and nursing note reviewed.  Constitutional:      General: He is not in acute distress.    Appearance: He is well-developed.  HENT:     Head: Normocephalic and atraumatic.     Mouth/Throat:     Mouth: Mucous membranes are moist.     Pharynx: Pharyngeal swelling and  posterior oropharyngeal erythema present. No oropharyngeal exudate.     Tonsils: No tonsillar abscesses.  Eyes:     General: Vision grossly intact. Gaze aligned appropriately.     Extraocular Movements: Extraocular movements intact.     Conjunctiva/sclera: Conjunctivae normal.  Cardiovascular:     Rate and Rhythm: Normal rate and regular rhythm.     Pulses: Normal pulses.     Heart sounds: Normal heart sounds, S1 normal and S2 normal. No murmur heard.    No friction rub. No gallop.  Pulmonary:     Effort: Pulmonary effort is normal. No respiratory distress.     Breath sounds: Normal breath sounds.  Abdominal:     Palpations: Abdomen is soft.     Tenderness: There is no abdominal tenderness. There is no guarding or rebound.     Hernia: No hernia is present.  Musculoskeletal:        General: No swelling.     Cervical back: Full passive range of motion without pain, normal range of motion and neck supple. No pain with movement, spinous process tenderness or muscular tenderness. Normal range of motion.     Right lower leg: No edema.     Left lower leg: No edema.  Lymphadenopathy:     Cervical: Cervical  adenopathy present.  Skin:    General: Skin is warm and dry.     Capillary Refill: Capillary refill takes less than 2 seconds.     Findings: No ecchymosis, erythema, lesion or wound.  Neurological:     Mental Status: He is alert and oriented to person, place, and time.     GCS: GCS eye subscore is 4. GCS verbal subscore is 5. GCS motor subscore is 6.     Cranial Nerves: Cranial nerves 2-12 are intact.     Sensory: Sensation is intact.     Motor: Motor function is intact. No weakness or abnormal muscle tone.     Coordination: Coordination is intact.  Psychiatric:        Mood and Affect: Mood normal.        Speech: Speech normal.        Behavior: Behavior normal.     ED Results / Procedures / Treatments   Labs (all labs ordered are listed, but only abnormal results are  displayed) Labs Reviewed - No data to display  EKG None  Radiology No results found.  Procedures Procedures    Medications Ordered in ED Medications  predniSONE (DELTASONE) tablet 60 mg (has no administration in time range)  amoxicillin (AMOXIL) capsule 500 mg (has no administration in time range)    ED Course/ Medical Decision Making/ A&P                             Medical Decision Making  Presents to the emergency department for persistent sore throat.  Recent testing for strep, influenza, COVID were negative.  Examination reveals diffuse soft tissue swelling and edema with erythema.  Oropharynx is symmetric, no sign of tonsillar abscess.        Final Clinical Impression(s) / ED Diagnoses Final diagnoses:  Pharyngitis, unspecified etiology    Rx / DC Orders ED Discharge Orders          Ordered    predniSONE (DELTASONE) 20 MG tablet  Daily with breakfast        09/30/22 0312    amoxicillin (AMOXIL) 500 MG capsule  3 times daily        09/30/22 0312              Orpah Greek, MD 09/30/22 [phone]

## 2022-09-30 NOTE — ED Triage Notes (Signed)
Pt arrived via POV from home c/o left-sided sore throat reporting it feels like razor blades when trying to swallow fluids and solids. Pt reports this has been persistent since this most recent visit here to [place]. Pt reports trying cough drops at home w/o relief of symptoms.

## 2022-10-30 ENCOUNTER — Ambulatory Visit: Payer: Medicaid Other | Admitting: Family Medicine

## 2022-10-30 ENCOUNTER — Encounter: Payer: Self-pay | Admitting: Family Medicine

## 2022-10-30 VITALS — BP 127/65 | HR 71 | Temp 98.1°F | Ht 70.0 in | Wt 180.0 lb

## 2022-10-30 DIAGNOSIS — R1084 Generalized abdominal pain: Secondary | ICD-10-CM | POA: Insufficient documentation

## 2022-10-30 MED ORDER — DICYCLOMINE HCL 20 MG PO TABS: 20.0000 mg | ORAL_TABLET | Freq: Four times a day (QID) | ORAL | 1 refills | Status: AC | PRN

## 2022-10-30 NOTE — Patient Instructions (Signed)
Medication as directed.  You will receive contact regarding scheduling an appointment with GI.  Follow-up annually or sooner if needed.

## 2022-10-30 NOTE — Progress Notes (Signed)
Subjective:  Patient ID: Russell Townsend, male    DOB: 04-26-2001  Age: 22 y.o. MRN: [id number]  CC: Chief Complaint  Patient presents with   Establish Care    Constipation/ loose stool , ABD pain with meals for years family history of chrons    HPI:  22 year old male presents for evaluation of the above.  Patient reports that he has had ongoing symptoms for the past 3 to 4 years.  He states that he has mostly abdominal cramping and pain particularly after he eats.  He states that he really does not have diarrhea.  He does use the bathroom frequently.  He has 3-4 bowel movements a day.  No melena.  No hematochezia.  No mucus in the stool.  No reports of weight loss.  He states that both his mother and his brother have Crohn's disease.  Social Hx   Social History   Socioeconomic History   Marital status: Single    Spouse name: Not on file   Number of children: Not on file   Years of education: Not on file   Highest education level: Not on file  Occupational History   Not on file  Tobacco Use   Smoking status: Never   Smokeless tobacco: Never  Vaping Use   Vaping Use: Never used  Substance and Sexual Activity   Alcohol use: No   Drug use: No   Sexual activity: Not on file  Other Topics Concern   Not on file  Social History Narrative   Lives with paternal grandparents   No smokers   Social Determinants of Health   Financial Resource Strain: Not on file  Food Insecurity: Not on file  Transportation Needs: Not on file  Physical Activity: Not on file  Stress: Not on file  Social Connections: Not on file    Review of Systems Per HPI  Objective:  BP 127/65   Pulse 71   Temp 98.1 F (36.7 C)   Ht 5' 10"$  (1.778 m)   Wt 180 lb (81.6 kg)   SpO2 98%   BMI 25.83 kg/m      10/30/2022    1:10 PM 09/30/2022    2:57 AM 09/30/2022    2:56 AM  BP/Weight  Systolic BP [id number]  [id number]  Diastolic BP 65  79  Wt. (Lbs) 180 175   BMI 25.83 kg/m2 25.11 kg/m2     Physical  Exam Constitutional:      General: He is not in acute distress.    Appearance: Normal appearance.  HENT:     Head: Normocephalic and atraumatic.  Eyes:     General:        Right eye: No discharge.        Left eye: No discharge.     Conjunctiva/sclera: Conjunctivae normal.  Cardiovascular:     Rate and Rhythm: Normal rate and regular rhythm.  Pulmonary:     Effort: Pulmonary effort is normal.     Breath sounds: Normal breath sounds. No wheezing, rhonchi or rales.  Abdominal:     General: There is no distension.     Palpations: Abdomen is soft.     Tenderness: There is no abdominal tenderness. There is no guarding or rebound.  Neurological:     Mental Status: He is alert.     Lab Results  Component Value Date   WBC 8.4 01/24/2021   HGB 16.3 01/24/2021   HCT 48.6 01/24/2021   PLT 201 01/24/2021   GLUCOSE  102 (H) 01/24/2021   NA 134 (L) 01/24/2021   K 4.0 01/24/2021   CL 99 01/24/2021   CREATININE 1.00 01/24/2021   BUN 9 01/24/2021   CO2 25 01/24/2021     Assessment & Plan:   Problem List Items Addressed This Visit       Other   Generalized abdominal pain - Primary    Benign abdominal exam.  History is most consistent with IBS.  However, given his family history of Crohn's disease, I feel that he needs to be evaluated by GI.  Dicyclomine as directed.        Relevant Orders   Ambulatory referral to Gastroenterology    Meds ordered this encounter  Medications   dicyclomine (BENTYL) 20 MG tablet    Sig: Take 1 tablet (20 mg total) by mouth every 6 (six) hours as needed (Abdominal cramping/pain).    Dispense:  90 tablet    Refill:  1    Follow-up: Annually or sooner if needed.  [place]

## 2022-10-30 NOTE — Assessment & Plan Note (Signed)
Benign abdominal exam.  History is most consistent with IBS.  However, given his family history of Crohn's disease, I feel that he needs to be evaluated by GI.  Dicyclomine as directed.

## 2022-11-01 ENCOUNTER — Encounter: Payer: Self-pay | Admitting: Internal Medicine

## 2022-11-28 ENCOUNTER — Ambulatory Visit: Payer: Medicaid Other | Admitting: Internal Medicine

## 2023-01-02 ENCOUNTER — Ambulatory Visit: Payer: Medicaid Other | Admitting: Internal Medicine

## 2023-01-03 ENCOUNTER — Encounter: Payer: Self-pay | Admitting: Internal Medicine

## 2023-04-30 ENCOUNTER — Telehealth: Payer: Self-pay
